# Patient Record
Sex: Female | Born: 2000 | Race: Black or African American | Hispanic: No | Marital: Single | Smoking: Never smoker
Health system: Southern US, Community
[De-identification: ages and names within clinical notes are randomized; demographics above are authoritative.]

## PROBLEM LIST (undated history)

## (undated) DIAGNOSIS — A4902 Methicillin resistant Staphylococcus aureus infection, unspecified site: Secondary | ICD-10-CM

---
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
## 2020-07-23 IMAGING — DX DG SHOULDER 2+V*R*
2 series · 2 of 2 positions shown · non-contrast
Comparison: None.

CLINICAL DATA: 17-year-old female with right shoulder pain.

EXAM:
RIGHT SHOULDER - 2+ VIEW; RIGHT CLAVICLE - 2+ VIEWS

[shoulder y view]
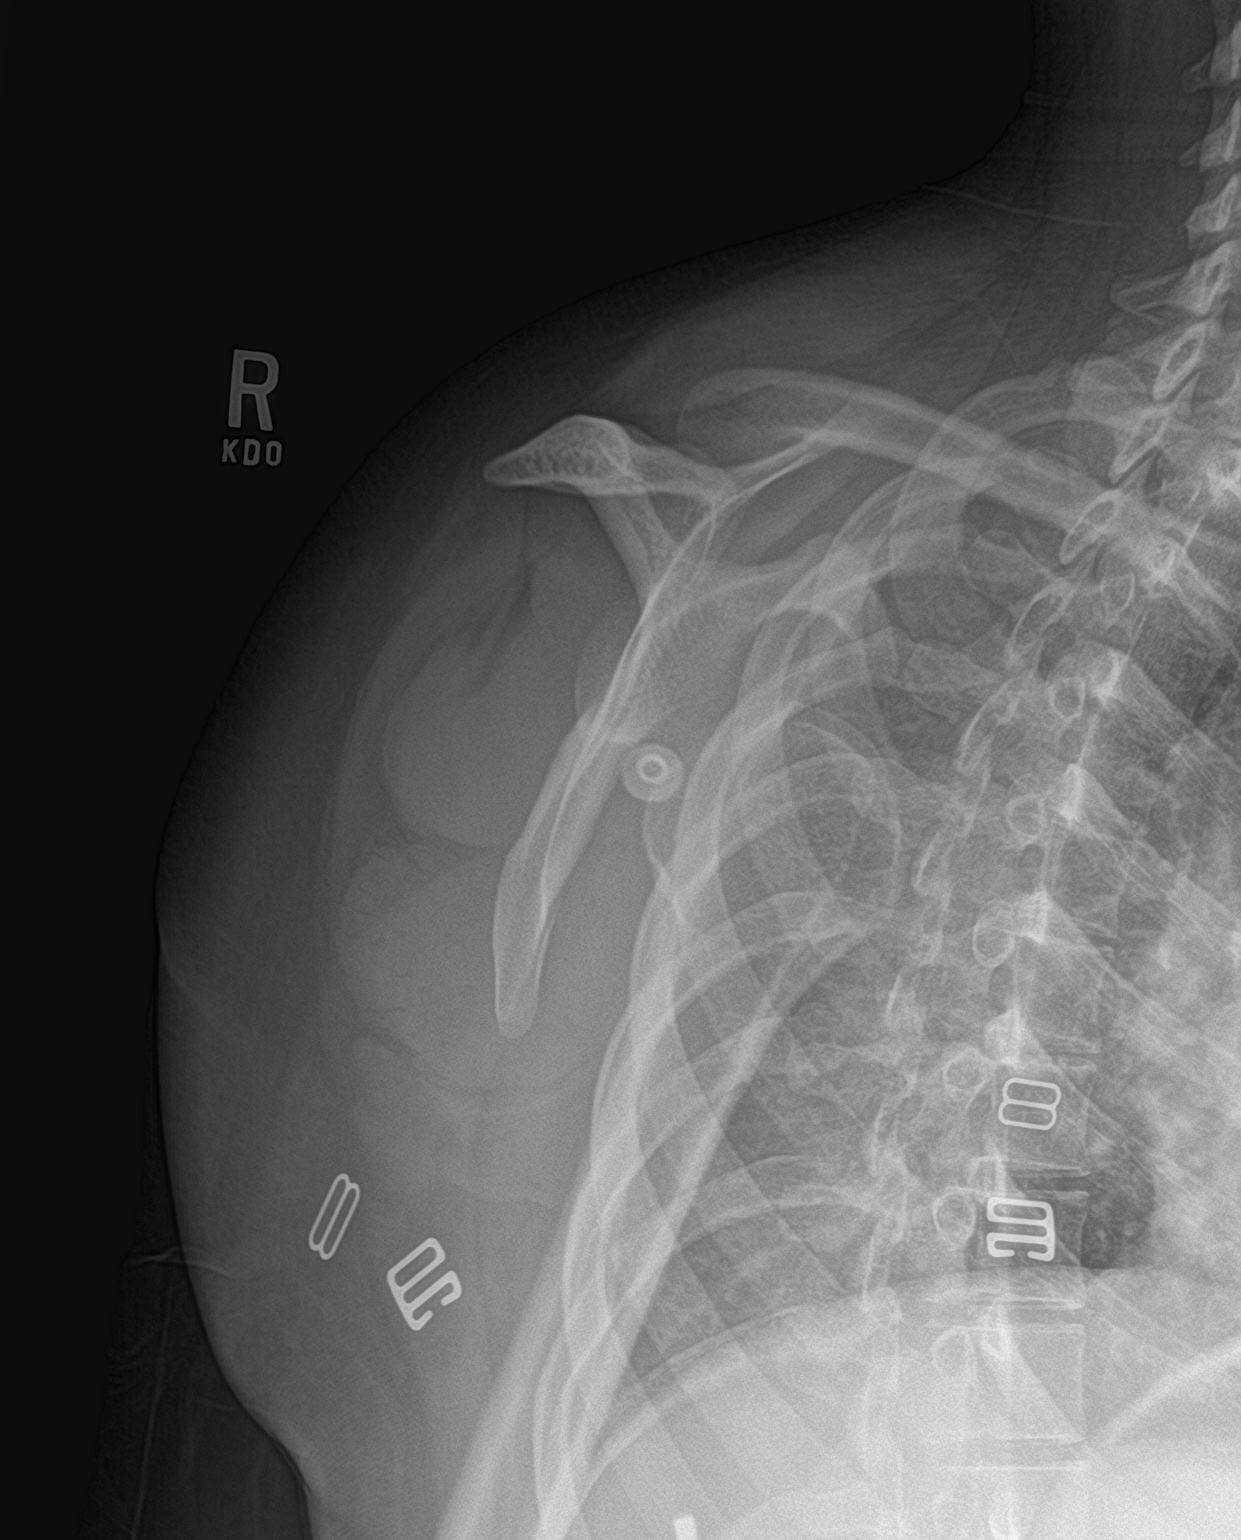

[shoulder ap neutral]
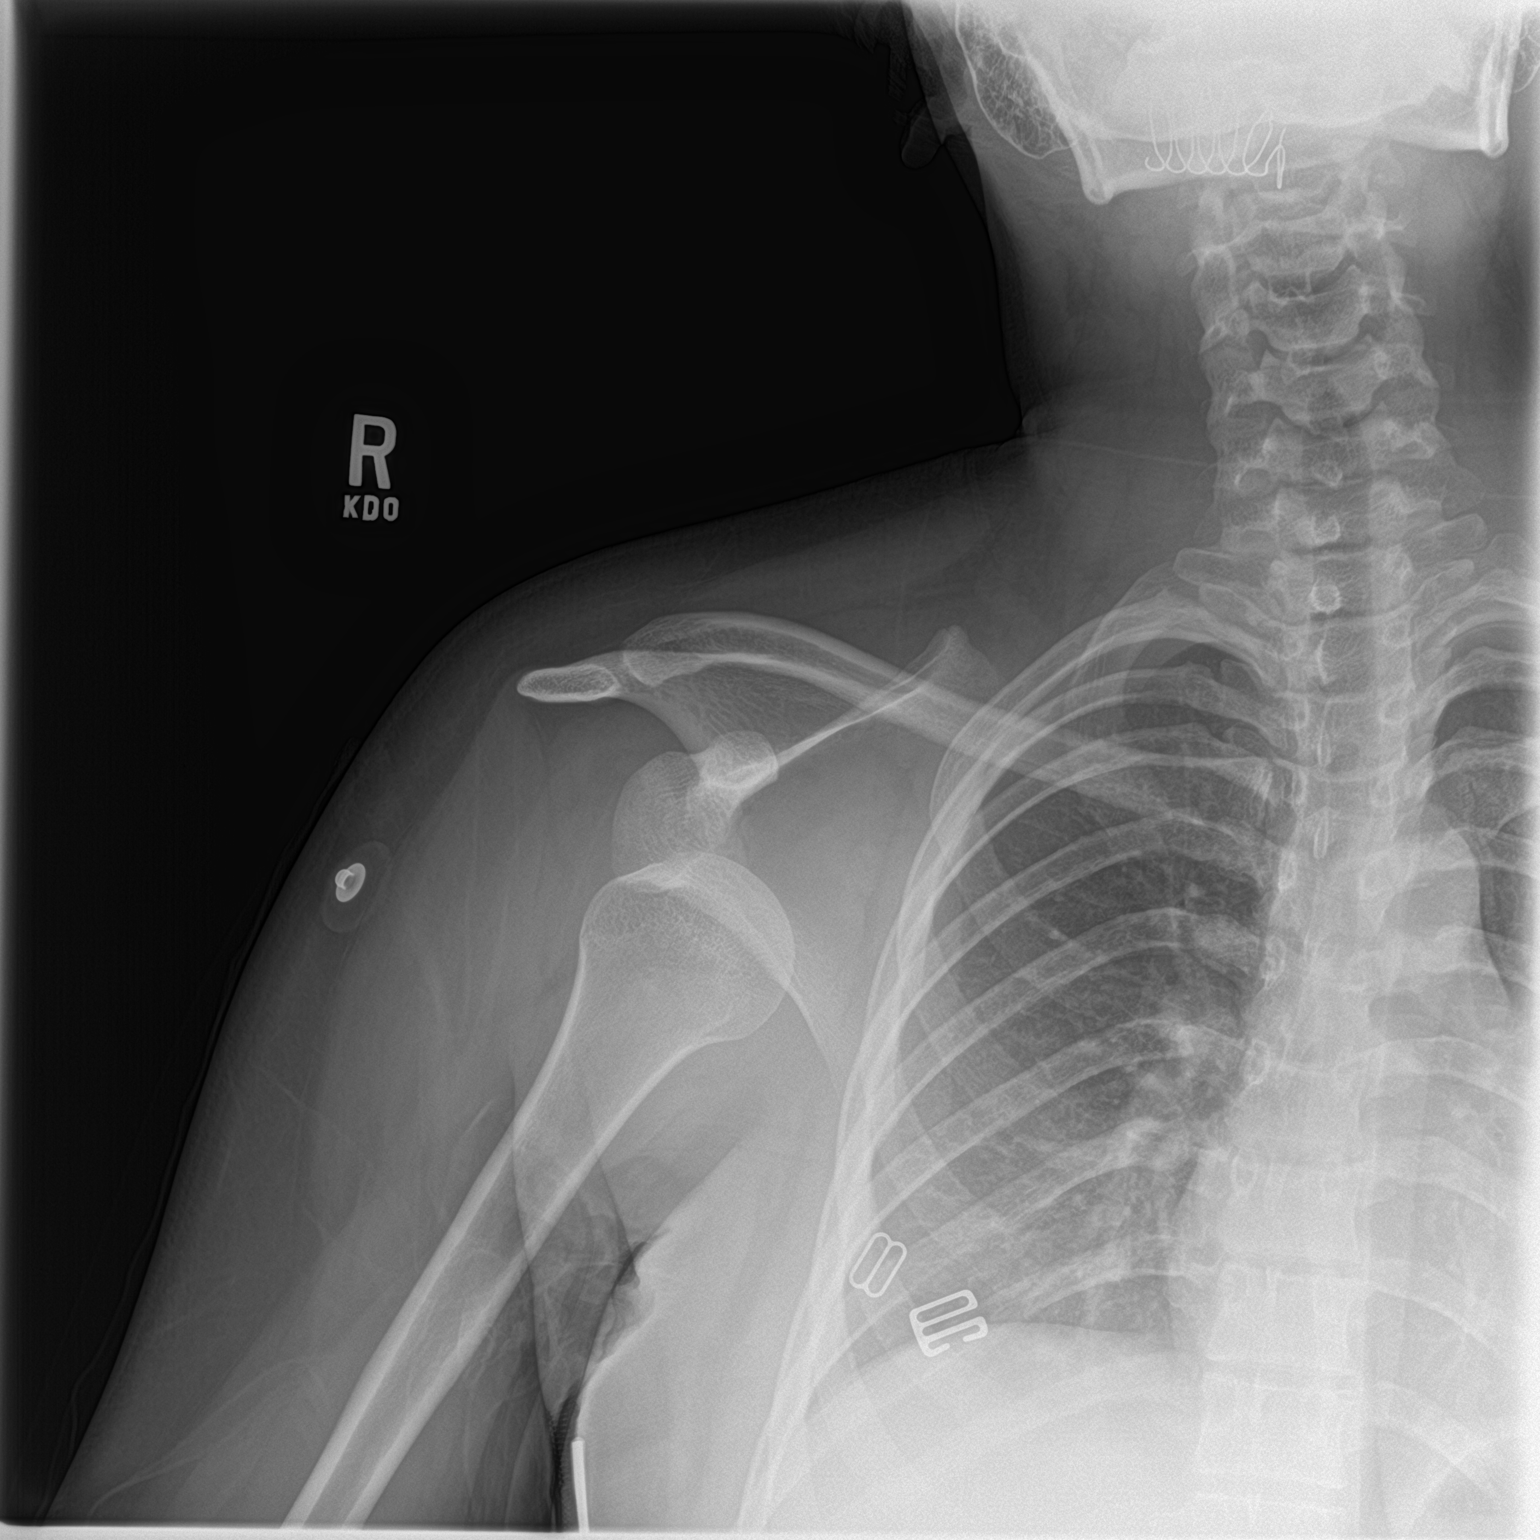

[2 of 2 positions shown; findings below may reference images not displayed]

FINDINGS: There is anteriorly dislocated right humerus. There is a focal area
of indentation of the superolateral aspect of the humeral cortex
which may represent a Hill-Sachs injury. The soft tissues are
unremarkable.
IMPRESSION: Anterior dislocation of the right shoulder with possible Hill-Sachs
injury of the humeral head.

## 2020-07-23 IMAGING — DX DG SHOULDER 2+V PORT*R*
2 series · 2 of 2 positions shown · non-contrast
Comparison: RIGHT shoulder radiographs March 26, 2018 at 2233
hours

CLINICAL DATA: Post reduction.

EXAM:
PORTABLE RIGHT SHOULDER

[shoulder ap (1 of 2)]
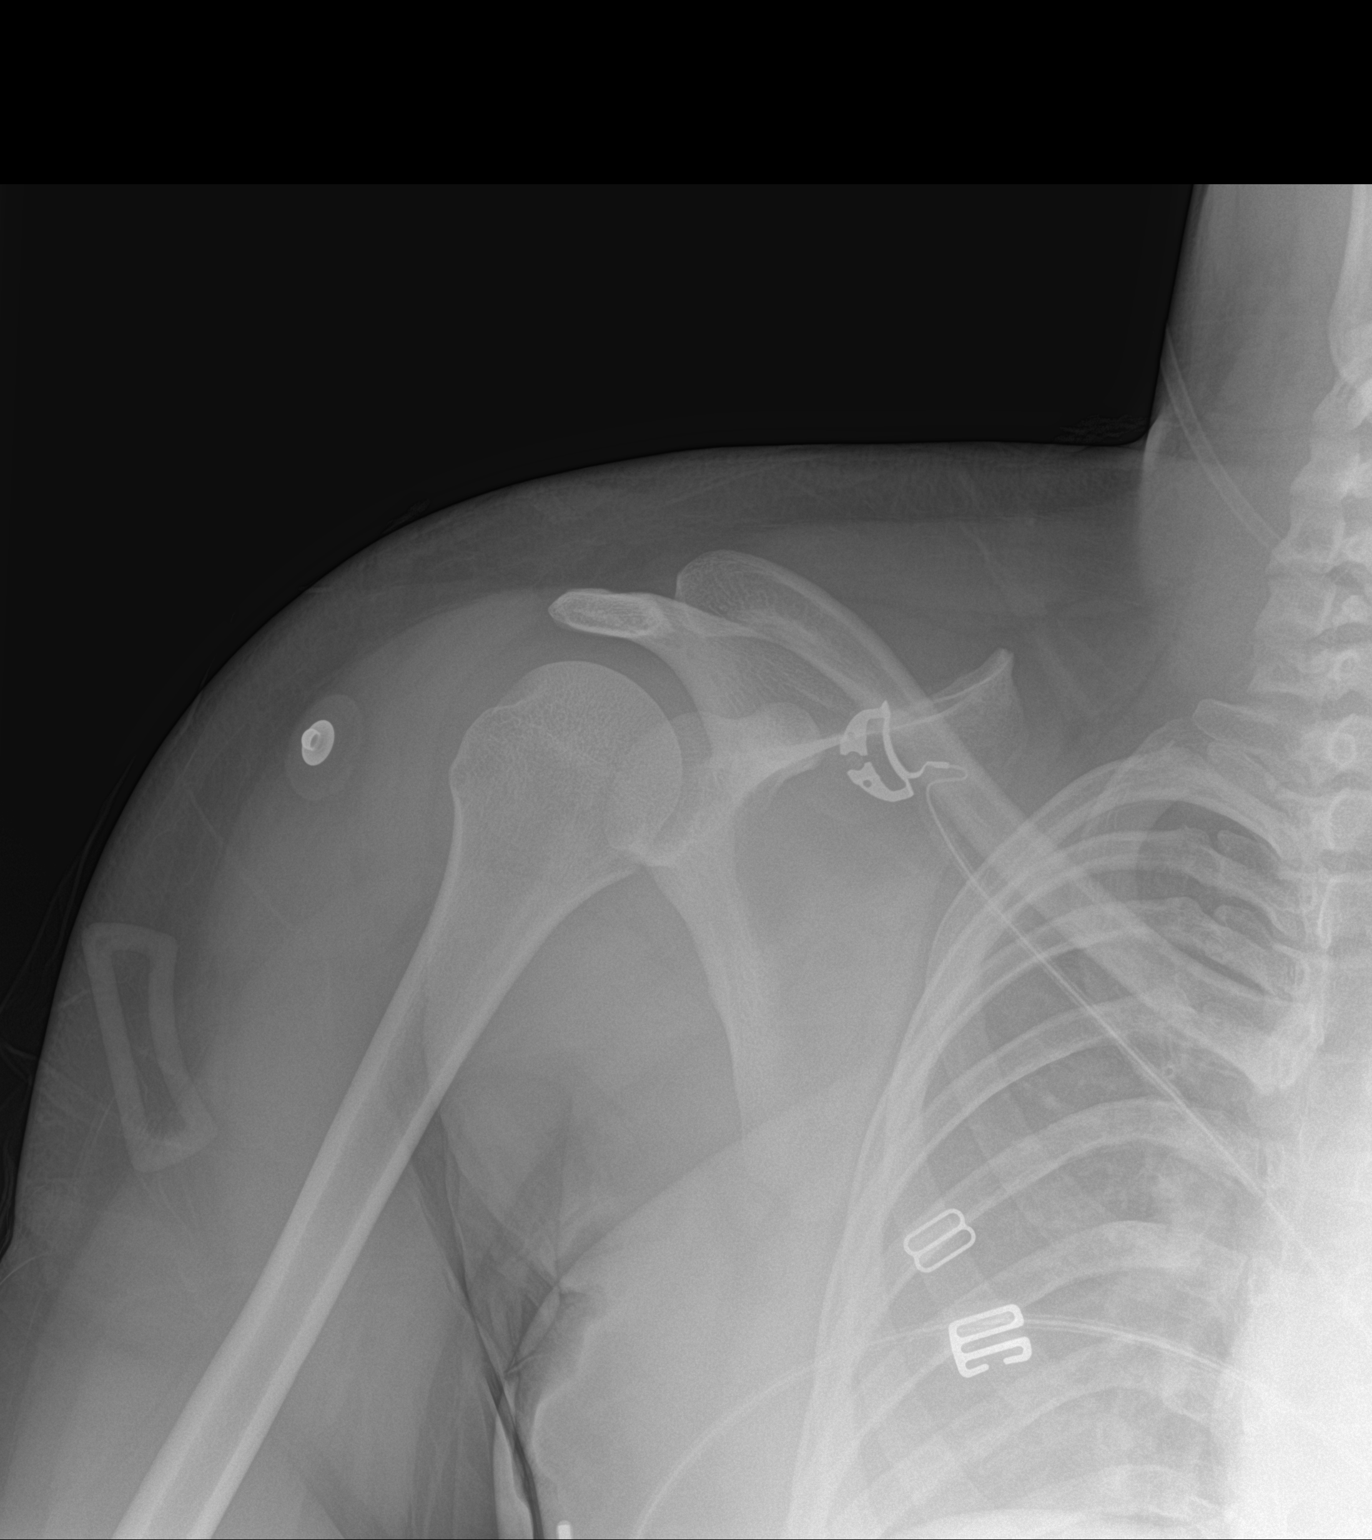

[shoulder ap (2 of 2)]
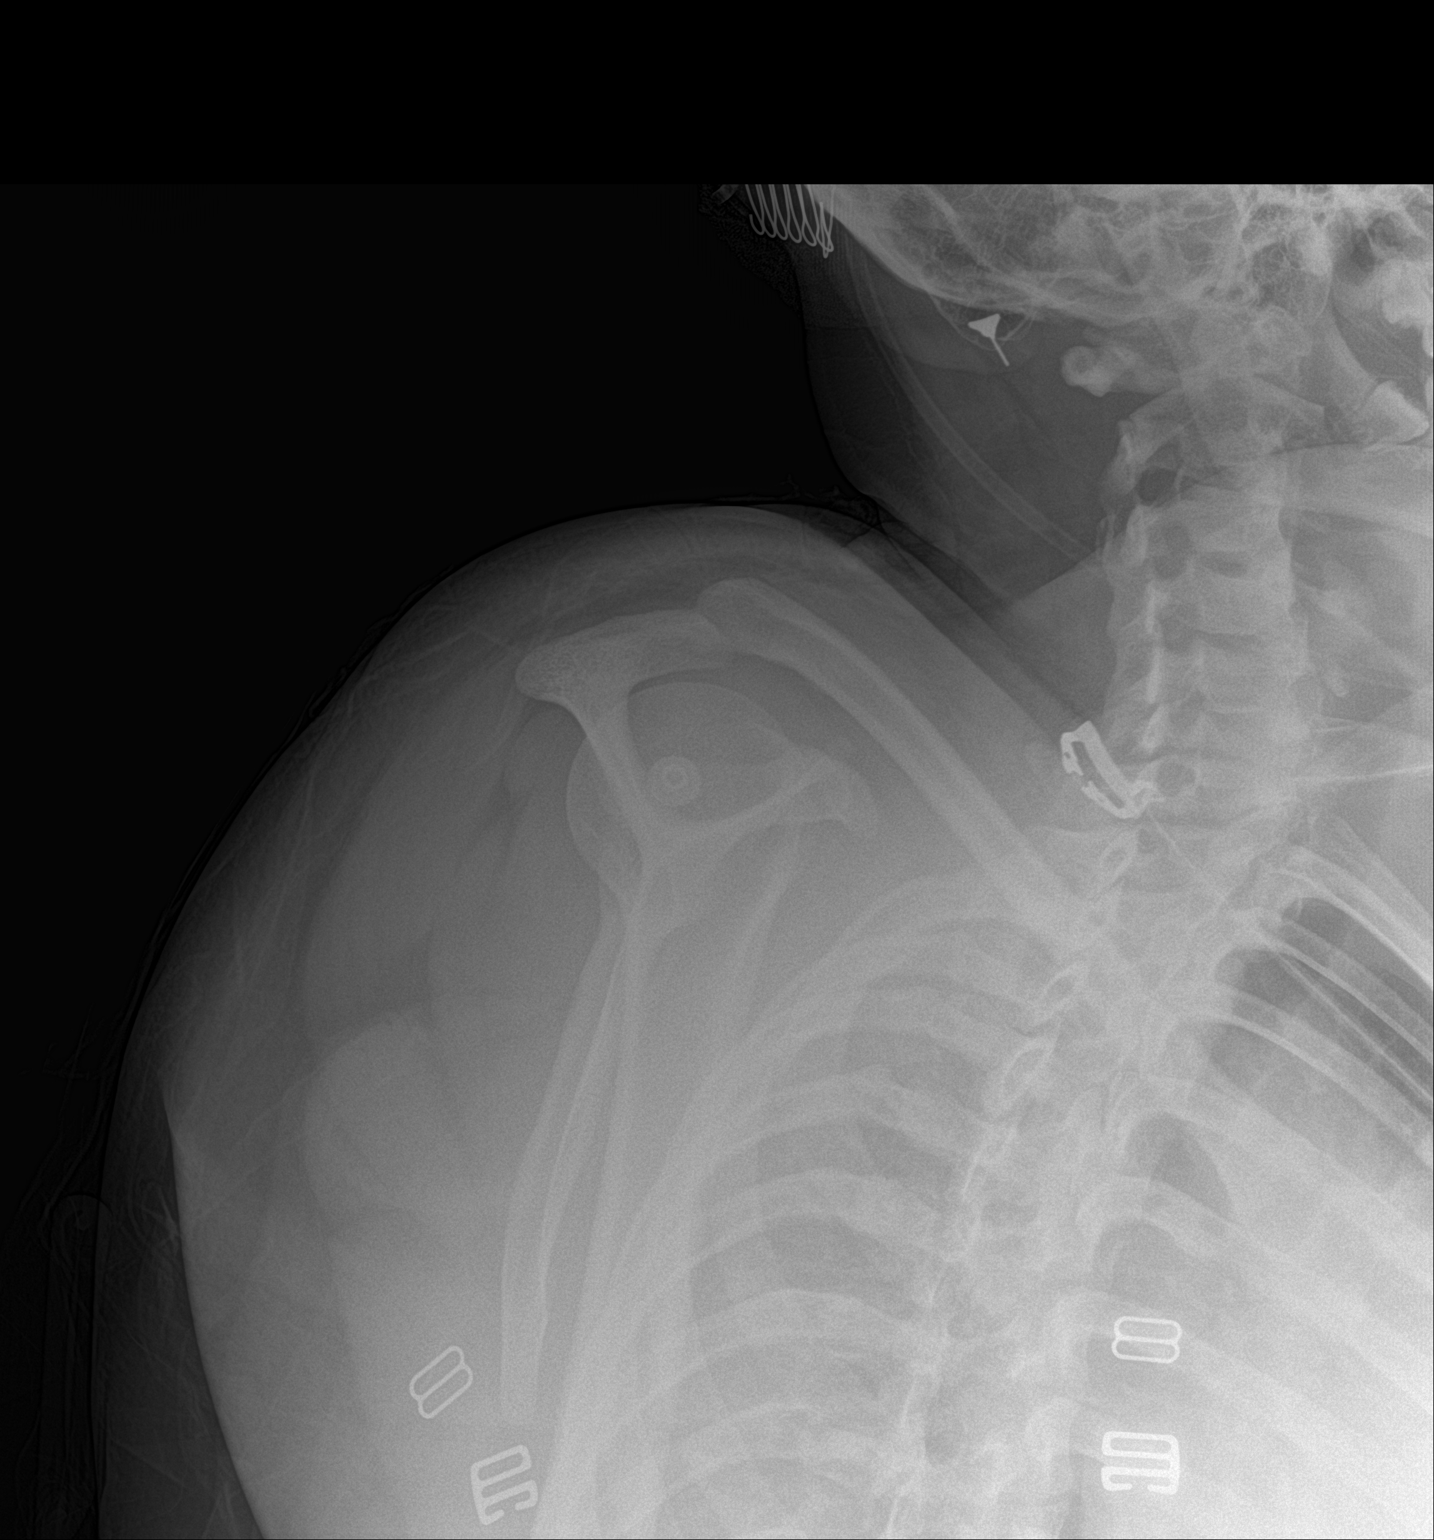

[2 of 2 positions shown; findings below may reference images not displayed]

FINDINGS: Mild potential Hill-Sachs deformity. The subacromial, glenohumeral
and acromioclavicular joint spaces are intact. No destructive bony
lesions. Soft tissue planes are non-suspicious.
IMPRESSION: Successful closed reduction RIGHT shoulder dislocation. Mild
potential Hill-Sachs deformity.
